# Patient Record
Sex: Female | Born: 1985 | Race: White | Hispanic: No | Marital: Married
Health system: Southern US, Community
[De-identification: ages and names within clinical notes are randomized; demographics above are authoritative.]

---

## 2014-04-23 ENCOUNTER — Observation Stay: Payer: Self-pay
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
